# Patient Record
Sex: Female | Born: 1990 | Race: Black or African American | Hispanic: No | Marital: Single | Smoking: Never smoker
Health system: Southern US, Community
[De-identification: ages and names within clinical notes are randomized; demographics above are authoritative.]

---
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
## 2009-09-06 ENCOUNTER — Emergency Department (HOSPITAL_COMMUNITY): Admission: EM | Admit: 2009-09-06 | Discharge: 2009-09-06 | Payer: Self-pay | Admitting: Family Medicine

## 2011-02-05 LAB — POCT URINALYSIS DIP (DEVICE)
Nitrite: NEGATIVE
Protein, ur: 100 mg/dL — AB
Specific Gravity, Urine: 1.02 (ref 1.005–1.030)
Urobilinogen, UA: 0.2 mg/dL (ref 0.0–1.0)

## 2011-03-07 ENCOUNTER — Inpatient Hospital Stay (INDEPENDENT_AMBULATORY_CARE_PROVIDER_SITE_OTHER)
Admission: RE | Admit: 2011-03-07 | Discharge: 2011-03-07 | Disposition: A | Discharge status: Home / Self Care | Payer: 59 | Source: Ambulatory Visit | Attending: Family Medicine | Admitting: Family Medicine

## 2011-03-07 DIAGNOSIS — J020 Streptococcal pharyngitis: Secondary | ICD-10-CM

## 2011-03-07 LAB — POCT RAPID STREP A (OFFICE): Streptococcus, Group A Screen (Direct): NEGATIVE

## 2013-05-17 ENCOUNTER — Emergency Department (HOSPITAL_COMMUNITY)
Admission: EM | Admit: 2013-05-17 | Discharge: 2013-05-17 | Disposition: A | Discharge status: Home / Self Care | Payer: BC Managed Care – PPO | Source: Home / Self Care

## 2013-05-17 ENCOUNTER — Encounter (HOSPITAL_COMMUNITY): Payer: Self-pay | Admitting: Emergency Medicine

## 2013-05-17 DIAGNOSIS — S0990XA Unspecified injury of head, initial encounter: Secondary | ICD-10-CM

## 2013-05-17 DIAGNOSIS — R55 Syncope and collapse: Secondary | ICD-10-CM

## 2013-05-17 DIAGNOSIS — R42 Dizziness and giddiness: Secondary | ICD-10-CM

## 2013-05-17 NOTE — ED Notes (Signed)
Pt c/o a temp syncope onset 1130 this am... Reports she was at work just observing a [employer] when all of sudden she felt dizzy and as she tried to take a sit, she fell and hit the back of her head against the floor... Reports she only felt dizzy for a couple of seconds... Prior to this episode, she was fine, asymptomatic.... Also reports similar sxs about 9 weeks ago but only last for few seconds... Also states she did not have any breakfast and she was [place]... Currently voices no concerns; denies CP, SOB, HA, dizziness... She is alert w/no signs of acute distress; NAD.

## 2013-05-17 NOTE — ED Provider Notes (Signed)
Medical screening examination/treatment/procedure(s) were performed by resident physician or non-physician practitioner and as supervising physician I was immediately available for consultation/collaboration.   Lexee Brashears DOUGLAS MD.   Mada Sadik D Emika Tiano, MD 05/17/13 1741 

## 2013-05-17 NOTE — ED Provider Notes (Signed)
History    CSN: [id number] Arrival date & time 05/17/13  1234  First MD Initiated Contact with Patient 05/17/13 1330     Chief Complaint  Patient presents with  . Near Syncope   (Consider location/radiation/quality/duration/timing/severity/associated sxs/prior Treatment) HPI Comments: As above, this 22 year old female was at school this morning around 11:30 AM when she began to feel very dizzy. She felt like she could not stand but was unable to find a place to sit down. Unfortunately, she had a syncopal episode with loss of consciousness and struck her head on the floor. The next thing she remembers she was being carried out of the room into the hall where it was cooler. She noticed that she had experienced severe diaphoresis and was went all over. She did not have incontinence. She denied shortness of breath or chest pain but did feel an episode of rapid heartbeat just prior to passing out. His similar episode occurred approximately one month ago when she fell. Dizzy for approximately 30 minutes. This was not followed by syncope and she felt no head pain, headache, chest pain or arrhythmia. The symptoms abated spontaneously.  History reviewed. No pertinent past medical history. History reviewed. No pertinent past surgical history. No family history on file. History  Substance Use Topics  . Smoking status: Never Smoker   . Smokeless tobacco: Not on file  . Alcohol Use: Yes   OB History   Grav Para Term Preterm Abortions TAB SAB Ect Mult Living                 Review of Systems  Constitutional: Negative.   HENT: Negative.   Eyes: Negative.   Respiratory: Negative.   Cardiovascular: Negative.   Gastrointestinal: Negative.   Genitourinary: Negative.   Musculoskeletal: Negative.   Skin: Negative.   Neurological: Positive for dizziness, syncope and light-headedness. Negative for tremors, seizures, speech difficulty, weakness and headaches.  Psychiatric/Behavioral: Negative.      Allergies  Review of patient's allergies indicates no known allergies.  Home Medications  No current outpatient prescriptions on file. BP 114/62  Pulse 107  Temp(Src) 98.2 F (36.8 C) (Oral)  Resp 18  SpO2 100%  LMP 05/06/2013 Physical Exam  Nursing note and vitals reviewed. Constitutional: She is oriented to person, place, and time. She appears well-developed and well-nourished. No distress.  HENT:  Head: Normocephalic and atraumatic.  Mouth/Throat: Oropharynx is clear and moist. No oropharyngeal exudate.  Eyes: EOM are normal. Pupils are equal, round, and reactive to light.  Neck: Normal range of motion. Neck supple. No thyromegaly present.  Cardiovascular: Normal rate and normal heart sounds.   Pulmonary/Chest: Effort normal and breath sounds normal. No respiratory distress. She has no wheezes. She has no rales.  Abdominal: Soft. There is no tenderness.  Musculoskeletal: Normal range of motion. She exhibits no edema and no tenderness.  Lymphadenopathy:    She has no cervical adenopathy.  Neurological: She is alert and oriented to person, place, and time. She has normal strength. She displays no tremor. No cranial nerve deficit or sensory deficit. She exhibits normal muscle tone. She displays a negative Romberg sign. Coordination and gait normal. GCS eye subscore is 4. GCS verbal subscore is 5. GCS motor subscore is 6.  Reflex Scores:      Patellar reflexes are 2+ on the right side and 2+ on the left side. Heel to toe are normal. Toe ascension normal.  Skin: Skin is warm and dry.  Psychiatric: She has a normal mood  and affect.    ED Course  Procedures (including critical care time) Labs Reviewed - No data to display No results found. 1. Syncope and collapse   2. Dizziness   3. Head trauma     MDM  The patient's physical and psychological examination is completely normal. However, she did have a syncopal event with full loss of consciousness. At that time she fell  and struck the back of her head. She feels well now and has no complaints. Due to the above events I have recommended that she be transferred to the emergency department for additional evaluation that might include a CT scan, lab work and other testing. Causes may include  cardiac dysrhythmias, intra-cranial lesions or bleeding, low fluid volume, inner ear disorder, metabolic disorder or other serious causes. The patient states she understands and feels well now and has no symptoms and declines to go to the emergency department. She is also advised to obtain a PCP as soon as possible for further workup and she experiences similar symptoms in the future she should go to emergency department.  Hayden Rasmussen, NP 05/17/13 1449  Hayden Rasmussen, NP 05/17/13 1451

## 2018-02-15 ENCOUNTER — Other Ambulatory Visit: Payer: Self-pay | Admitting: Obstetrics & Gynecology

## 2018-02-15 NOTE — Telephone Encounter (Signed)
Pt annual scheduled on 04/14/18, [facility] chart has arrived, medication confirmed, pt last annual was 12/18/16

## 2018-04-14 ENCOUNTER — Ambulatory Visit: Payer: BC Managed Care – PPO | Admitting: Obstetrics & Gynecology

## 2018-04-14 ENCOUNTER — Encounter: Payer: Self-pay | Admitting: Obstetrics & Gynecology

## 2018-04-14 VITALS — BP 130/84 | Ht 68.0 in | Wt 204.0 lb

## 2018-04-14 DIAGNOSIS — E6609 Other obesity due to excess calories: Secondary | ICD-10-CM

## 2018-04-14 DIAGNOSIS — Z3041 Encounter for surveillance of contraceptive pills: Secondary | ICD-10-CM

## 2018-04-14 DIAGNOSIS — Z6831 Body mass index (BMI) 31.0-31.9, adult: Secondary | ICD-10-CM

## 2018-04-14 DIAGNOSIS — Z01419 Encounter for gynecological examination (general) (routine) without abnormal findings: Secondary | ICD-10-CM | POA: Diagnosis not present

## 2018-04-14 MED ORDER — NORETHINDRONE ACET-ETHINYL EST 1.5-30 MG-MCG PO TABS: 1.0000 | ORAL_TABLET | Freq: Every day | ORAL | 4 refills | Status: AC

## 2018-04-14 NOTE — Progress Notes (Signed)
Aimee Kline 1991-08-19 [id number]   History:    27 y.o. G0 Single.  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  RP:  Established patient presenting for annual gyn exam   HPI: Well on continuous Junel 1.5/30  No breakthrough bleeding.  No pelvic pain.  Normal vaginal secretions.  Currently abstinent.  Urine and bowel movements normal.  Breasts normal.  Body mass index 31.02.  Has a lots of stress as she was completing her master, will now start exercising more again.  Past medical history,surgical history, family history and social history were all reviewed and documented in the EPIC chart.  Gynecologic History No LMP recorded. (Menstrual status: Oral contraceptives). Contraception: OCP (estrogen/progesterone) Last Pap: 12/2016. Results were: Negative Last mammogram: Never Bone Density: Never Colonoscopy: Never  Obstetric History OB History  Gravida Para Term Preterm AB Living  0 0 0 0 0 0  SAB TAB Ectopic Multiple Live Births  0 0 0 0 0     ROS: A ROS was performed and pertinent positives and negatives are included in the history.  GENERAL: No fevers or chills. HEENT: No change in vision, no earache, sore throat or sinus congestion. NECK: No pain or stiffness. CARDIOVASCULAR: No chest pain or pressure. No palpitations. PULMONARY: No shortness of breath, cough or wheeze. GASTROINTESTINAL: No abdominal pain, nausea, vomiting or diarrhea, melena or bright red blood per rectum. GENITOURINARY: No urinary frequency, urgency, hesitancy or dysuria. MUSCULOSKELETAL: No joint or muscle pain, no back pain, no recent trauma. DERMATOLOGIC: No rash, no itching, no lesions. ENDOCRINE: No polyuria, polydipsia, no heat or cold intolerance. No recent change in weight. HEMATOLOGICAL: No anemia or easy bruising or bleeding. NEUROLOGIC: No headache, seizures, numbness, tingling or weakness. PSYCHIATRIC: No depression, no loss of interest in normal activity or change in sleep pattern.     Exam:   BP  130/84   Ht 5\' 8"  (1.727 m)   Wt 204 lb (92.5 kg)   BMI 31.02 kg/m   Body mass index is 31.02 kg/m.  General appearance : Well developed well nourished female. No acute distress HEENT: Eyes: no retinal hemorrhage or exudates,  Neck supple, trachea midline, no carotid bruits, no thyroidmegaly Lungs: Clear to auscultation, no rhonchi or wheezes, or rib retractions  Heart: Regular rate and rhythm, no murmurs or gallops Breast:Examined in sitting and supine position were symmetrical in appearance, no palpable masses or tenderness,  no skin retraction, no nipple inversion, no nipple discharge, no skin discoloration, no axillary or supraclavicular lymphadenopathy Abdomen: no palpable masses or tenderness, no rebound or guarding Extremities: no edema or skin discoloration or tenderness  Pelvic: Vulva: Normal             Vagina: No gross lesions or discharge  Cervix: No gross lesions or discharge.  Pap reflex done.  Uterus AV, normal size, shape and consistency, non-tender and mobile  Adnexa  Without masses or tenderness  Anus: Normal   Assessment/Plan:  27 y.o. female for annual exam   1. Encounter for routine gynecological examination with Papanicolaou smear of cervix Normal gynecologic exam.  Pap reflex done.  Breast exam normal.  2. Encounter for surveillance of contraceptive pills Doing very well on continuous birth control pill with Junel 1.04/01/29.  No contraindication.  Prescription sent to pharmacy.  3. Class 1 obesity due to excess calories without serious comorbidity with body mass index (BMI) of 31.0 to 31.9 in adult Body mass index at 31.02.  Recommend physical activity with aerobic activities 5  times a week and weightlifting every 2 days.  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  Other orders - Norethindrone Acetate-Ethinyl Estradiol (JUNEL 1.5/30) 1.5-30 MG-MCG tablet; Take 1 tablet by mouth daily. Continuous  use  Genia DelMarie-Lyne Tayley Mudrick MD, 8:47 AM 04/14/2018

## 2018-04-14 NOTE — Patient Instructions (Signed)
1. Encounter for routine gynecological examination with Papanicolaou smear of cervix Normal gynecologic exam.  Pap reflex done.  Breast exam normal.  2. Encounter for surveillance of contraceptive pills Doing very well on continuous birth control pill with Junel 1.04/01/29.  No contraindication.  Prescription sent to pharmacy.  3. Class 1 obesity due to excess calories without serious comorbidity with body mass index (BMI) of 31.0 to 31.9 in adult Body mass index at 31.02.  Recommend physical activity with aerobic activities 5 times a week and weightlifting every 2 days.  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  Other orders - Norethindrone Acetate-Ethinyl Estradiol (JUNEL 1.5/30) 1.5-30 MG-MCG tablet; Take 1 tablet by mouth daily. Continuous use  Aimee Kline, it was a pleasure seeing you today!  I will inform you of your results as soon as they are available.

## 2018-04-16 LAB — PAP IG W/ RFLX HPV ASCU

## 2019-04-22 ENCOUNTER — Encounter: Payer: BC Managed Care – PPO | Admitting: Obstetrics & Gynecology

## 2019-04-29 ENCOUNTER — Ambulatory Visit: Payer: BC Managed Care – PPO | Admitting: Obstetrics & Gynecology

## 2019-04-29 ENCOUNTER — Other Ambulatory Visit: Payer: Self-pay

## 2019-05-09 ENCOUNTER — Ambulatory Visit: Payer: Self-pay | Admitting: Obstetrics & Gynecology

## 2019-05-09 DIAGNOSIS — Z0289 Encounter for other administrative examinations: Secondary | ICD-10-CM

## 2019-05-23 ENCOUNTER — Encounter: Payer: BC Managed Care – PPO | Admitting: Obstetrics & Gynecology

## 2019-05-26 ENCOUNTER — Encounter: Payer: BC Managed Care – PPO | Admitting: Obstetrics & Gynecology

## 2019-06-30 ENCOUNTER — Other Ambulatory Visit: Payer: Self-pay | Admitting: Obstetrics & Gynecology
